# Patient Record
Sex: Male | Born: 1970 | Race: White | Hispanic: No | Marital: Married | Smoking: Never smoker
Health system: Southern US, Community
[De-identification: ages and names within clinical notes are randomized; demographics above are authoritative.]

## PROBLEM LIST (undated history)

## (undated) DIAGNOSIS — E119 Type 2 diabetes mellitus without complications: Secondary | ICD-10-CM

## (undated) DIAGNOSIS — K219 Gastro-esophageal reflux disease without esophagitis: Secondary | ICD-10-CM

## (undated) DIAGNOSIS — J45909 Unspecified asthma, uncomplicated: Secondary | ICD-10-CM

## (undated) DIAGNOSIS — I10 Essential (primary) hypertension: Secondary | ICD-10-CM

## (undated) HISTORY — PX: SHOULDER ARTHROSCOPY: SHX128

## (undated) HISTORY — PX: OTHER SURGICAL HISTORY: SHX169

## (undated) HISTORY — PX: ABDOMINAL SURGERY: SHX537

## (undated) HISTORY — PX: KNEE ARTHROPLASTY: SHX992

## (undated) HISTORY — PX: WISDOM TOOTH EXTRACTION: SHX21

---

## 2005-04-02 ENCOUNTER — Ambulatory Visit: Payer: Self-pay | Admitting: Oncology

## 2005-04-20 ENCOUNTER — Ambulatory Visit (HOSPITAL_COMMUNITY): Admission: RE | Admit: 2005-04-20 | Discharge: 2005-04-20 | Payer: Self-pay | Admitting: Thoracic Surgery

## 2016-05-12 DIAGNOSIS — D862 Sarcoidosis of lung with sarcoidosis of lymph nodes: Secondary | ICD-10-CM | POA: Diagnosis not present

## 2016-05-12 DIAGNOSIS — J45909 Unspecified asthma, uncomplicated: Secondary | ICD-10-CM | POA: Diagnosis not present

## 2016-05-12 DIAGNOSIS — E669 Obesity, unspecified: Secondary | ICD-10-CM | POA: Diagnosis not present

## 2016-05-12 DIAGNOSIS — E119 Type 2 diabetes mellitus without complications: Secondary | ICD-10-CM | POA: Diagnosis not present

## 2016-11-26 DIAGNOSIS — E119 Type 2 diabetes mellitus without complications: Secondary | ICD-10-CM | POA: Diagnosis not present

## 2016-11-26 DIAGNOSIS — Z0000 Encounter for general adult medical examination without abnormal findings: Secondary | ICD-10-CM | POA: Diagnosis not present

## 2016-11-26 DIAGNOSIS — D862 Sarcoidosis of lung with sarcoidosis of lymph nodes: Secondary | ICD-10-CM | POA: Diagnosis not present

## 2016-11-26 DIAGNOSIS — E785 Hyperlipidemia, unspecified: Secondary | ICD-10-CM | POA: Diagnosis not present

## 2017-02-22 DIAGNOSIS — J209 Acute bronchitis, unspecified: Secondary | ICD-10-CM | POA: Diagnosis not present

## 2018-02-09 DIAGNOSIS — J069 Acute upper respiratory infection, unspecified: Secondary | ICD-10-CM | POA: Diagnosis not present

## 2018-03-02 DIAGNOSIS — Z6830 Body mass index (BMI) 30.0-30.9, adult: Secondary | ICD-10-CM | POA: Diagnosis not present

## 2018-03-02 DIAGNOSIS — J4520 Mild intermittent asthma, uncomplicated: Secondary | ICD-10-CM | POA: Diagnosis not present

## 2018-03-02 DIAGNOSIS — Z1389 Encounter for screening for other disorder: Secondary | ICD-10-CM | POA: Diagnosis not present

## 2018-03-02 DIAGNOSIS — Z0000 Encounter for general adult medical examination without abnormal findings: Secondary | ICD-10-CM | POA: Diagnosis not present

## 2018-03-02 DIAGNOSIS — E119 Type 2 diabetes mellitus without complications: Secondary | ICD-10-CM | POA: Diagnosis not present

## 2018-03-02 DIAGNOSIS — E669 Obesity, unspecified: Secondary | ICD-10-CM | POA: Diagnosis not present

## 2018-03-22 DIAGNOSIS — M9905 Segmental and somatic dysfunction of pelvic region: Secondary | ICD-10-CM | POA: Diagnosis not present

## 2018-03-22 DIAGNOSIS — M545 Low back pain: Secondary | ICD-10-CM | POA: Diagnosis not present

## 2018-03-22 DIAGNOSIS — M542 Cervicalgia: Secondary | ICD-10-CM | POA: Diagnosis not present

## 2018-03-22 DIAGNOSIS — M9903 Segmental and somatic dysfunction of lumbar region: Secondary | ICD-10-CM | POA: Diagnosis not present

## 2020-10-08 ENCOUNTER — Other Ambulatory Visit: Payer: Self-pay | Admitting: Orthopaedic Surgery

## 2020-10-08 DIAGNOSIS — M25562 Pain in left knee: Secondary | ICD-10-CM

## 2020-10-19 ENCOUNTER — Ambulatory Visit
Admission: RE | Admit: 2020-10-19 | Discharge: 2020-10-19 | Disposition: A | Discharge status: Home / Self Care | Payer: Commercial Managed Care - PPO | Source: Ambulatory Visit | Attending: Orthopaedic Surgery | Admitting: Orthopaedic Surgery

## 2020-10-19 DIAGNOSIS — M25562 Pain in left knee: Secondary | ICD-10-CM

## 2020-12-16 NOTE — H&P (Incomplete)
KNEE ARTHROPLASTY ADMISSION H&P  Patient ID: JIBRAN CROOKSHANKS MRN: [id number] DOB/AGE: [birth year] 48 y.o.  Chief Complaint: left knee pain.  Planned Procedure Date: 01/13/21 Medical Clearance by ***   Cardiac Clearance by *** Additional clearance by ***   HPI: Kylor Valverde Kopplin is a 50 y.o. 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 The patient has a history of pain and functional disability in the left knee due to arthritis and has failed non-surgical conservative treatments for greater than 12 weeks to include {nonsurgical conservative treatment (must select two):3049030}.  Onset of symptoms was {abrupt, gradual:20671}, starting {1->10 years:3049031} years ago with {stable, gradual worsening, rapidly worsening:3049032} course since that time. The patient noted {no past surgery, prior procedures:3049033} on the left knee.  Patient currently rates pain at {1-10:3049035} out of 10 with activity. Patient has {night pain, worsening of pain with activity and weight bearing:3049036}.  Patient has evidence of {Radiographic or MRI evidence of (must document one of the below):3046104} by imaging studies.  There is no active infection.  No past medical history on file. *** The histories are not reviewed yet. Please review them in the "History" navigator section and refresh this SmartLink. Not on File Prior to Admission medications   Not on File   Social History   Socioeconomic History   Marital status: Married    Spouse name: Not on file   Number of children: Not on file   Years of education: Not on file   Highest education level: Not on file  Occupational History   Not on file  Tobacco Use   Smoking status: Not on file   Smokeless tobacco: Not on file  Substance and Sexual Activity   Alcohol use: Not on file   Drug use: Not on file   Sexual activity: Not on file  Other Topics Concern   Not on file  Social History Narrative   Not on file   Social Determinants of Health    Financial Resource Strain: Not on file  Food Insecurity: Not on file  Transportation Needs: Not on file  Physical Activity: Not on file  Stress: Not on file  Social Connections: Not on file   No family history on file.  ROS: Currently denies lightheadedness, dizziness, Fever, chills, CP, SOB.***   No personal history of DVT, PE, MI, or CVA.*** No loose teeth or dentures*** All other systems have been reviewed and were otherwise currently negative with the exception of those mentioned in the HPI and as above.  Objective: Vitals: Ht: *** Wt: *** lbs Temp: *** BP: *** Pulse: *** O2 ***% on room air.   Physical Exam: General: Alert, NAD.  Antalgic Gait *** HEENT: EOMI, Good Neck Extension *** Pulm: No increased work of breathing.  Clear B/L A/P w/o crackle or wheeze. *** CV: RRR, No m/g/r appreciated *** GI: soft, NT, ND. BS x 4 quadrants Neuro: CN II-XII grossly intact without focal deficit.  Sensation intact distally Skin: No lesions in the area of chief complaint MSK/Surgical Site: *** knee w/o redness or effusion.  *** JLT. ROM ***.  5/5 strength in extension and flexion.  +EHL/FHL.  NVI.  Stable varus and valgus stress.    Imaging Review Plain radiographs demonstrate {mild/mod/severe:3049053} degenerative joint disease of the {left/right/bi:30031} knee.   The overall alignment is{Neutral/varus:3049054}. The bone quality appears to be {good/fair/poor/excellent:33178} for age and reported activity level.  Preoperative templating of the joint replacement has been completed, documented, and submitted to the Operating Room personnel  in order to optimize intra-operative equipment management.  Assessment: OA LEFT KNEE Active Problems:   * No active hospital problems. *   Plan: Plan for Procedure(s): TOTAL KNEE ARTHROPLASTY  The patient history, physical exam, clinical judgement of the provider and imaging are consistent with end stage degenerative joint disease and total  joint arthroplasty is deemed medically necessary. The treatment options including medical management, injection therapy, and arthroplasty were discussed at length. The risks and benefits of Procedure(s): TOTAL KNEE ARTHROPLASTY were presented and reviewed.  The risks of nonoperative treatment, versus surgical intervention including but not limited to continued pain, aseptic loosening, stiffness, dislocation/subluxation, infection, bleeding, nerve injury, blood clots, cardiopulmonary complications, morbidity, mortality, among others were discussed. The patient verbalizes understanding and wishes to proceed with the plan.  Patient is being admitted for inpatient treatment for surgery, pain control, PT, prophylactic antibiotics, VTE prophylaxis, progressive ambulation, ADL's and discharge planning.   Dental prophylaxis discussed and recommended for 2 years postoperatively.***  The patient does not*** meet the criteria for TXA which will be used perioperatively.   ASA 81 mg BID ***ASA 325 mg ***Xarelto  will be used postoperatively for DVT prophylaxis in addition to SCDs, and early ambulation. Plan for ***Tylenol, Celebrex, oxycodone for pain.   Zofran for nausea and vomiting. Pharmacy- *** The patient is planning to be discharged home with OPPT*** HHPT*** ([facility]***) and into the care of *** who can be reached at *** Follow up appt 02/03/21 at 4:30pm     [facility] Office [phone] 12/16/2020 7:52 PM

## 2020-12-30 NOTE — Patient Instructions (Addendum)
DUE TO COVID-19 ONLY ONE VISITOR IS ALLOWED TO COME WITH YOU AND STAY IN THE WAITING ROOM ONLY DURING PRE OP AND PROCEDURE.   **NO VISITORS ARE ALLOWED IN THE SHORT STAY AREA OR RECOVERY ROOM!!**       Your procedure is scheduled on: 01/13/21   Report to [facility] Main Entrance    Report to admitting at 7:00 AM   Call this number if you have problems the morning of surgery [phone]   Do not eat food :After Midnight.   May have liquids until 6:40 AM day of surgery  CLEAR LIQUID DIET  Foods Allowed                                                                     Foods Excluded  Water, Black Coffee and tea (no milk or creamer)           liquids that you cannot  Plain Jell-O in any flavor  (No red)                                    see through such as: Fruit ices (not with fruit pulp)                                            milk, soups, orange juice              Iced Popsicles (No red)                                                All solid food                                   Apple juices Sports drinks like Gatorade (No red) Lightly seasoned clear broth or consume(fat free) Sugar   The day of surgery:  Drink ONE (1) Pre-Surgery G2 by 6:40 am the morning of surgery. Drink in one sitting. Do not sip.  This drink was given to you during your hospital  pre-op appointment visit. Nothing else to drink after completing the  Pre-Surgery G2.          If you have questions, please contact your surgeon's office.     Oral Hygiene is also important to reduce your risk of infection.                                    Remember - BRUSH YOUR TEETH THE MORNING OF SURGERY WITH YOUR REGULAR TOOTHPASTE   Do NOT use tobacco after Midnight   Take these medicines the morning of surgery with A SIP OF WATER: Inhaler, Omeprazole, Pravastatin  How to Manage Your Diabetes Before and After Surgery  Why is it important to control my blood sugar before and after  surgery? Improving blood sugar levels before and after surgery helps healing and can limit problems. A way of improving blood sugar control is eating a healthy diet by:  Eating less sugar and carbohydrates  Increasing activity/exercise  Talking with your doctor about reaching your blood sugar goals High blood sugars (greater than 180 mg/dL) can raise your risk of infections and slow your recovery, so you will need to focus on controlling your diabetes during the weeks before surgery. Make sure that the doctor who takes care of your diabetes knows about your planned surgery including the date and location.  How do I manage my blood sugar before surgery? Check your blood sugar at least 4 times a day, starting 2 days before surgery, to make sure that the level is not too high or low. Check your blood sugar the morning of your surgery when you wake up and every 2 hours until you get to the Short Stay unit. If your blood sugar is less than 70 mg/dL, you will need to treat for low blood sugar: Do not take insulin. Treat a low blood sugar (less than 70 mg/dL) with  cup of clear juice (cranberry or apple), 4 glucose tablets, OR glucose gel. Recheck blood sugar in 15 minutes after treatment (to make sure it is greater than 70 mg/dL). If your blood sugar is not greater than 70 mg/dL on recheck, call [phone] for further instructions. Report your blood sugar to the short stay nurse when you get to Short Stay.  If you are admitted to the hospital after surgery: Your blood sugar will be checked by the staff and you will probably be given insulin after surgery (instead of oral diabetes medicines) to make sure you have good blood sugar levels. The goal for blood sugar control after surgery is 80-180 mg/dL.                              You may not have any metal on your body including jewelry, and body piercing             Do not wear lotions, powders, cologne, or deodorant              Men may shave  face and neck.   Do not bring valuables to the hospital. [facility] IS NOT             RESPONSIBLE   FOR VALUABLES.    Patients discharged on the day of surgery will not be allowed to drive home.   Special Instructions: Bring a copy of your healthcare power of attorney and living will documents         the day of surgery if you haven't scanned them before.              Please read over the following fact sheets you were given: IF YOU HAVE QUESTIONS ABOUT YOUR PRE-OP INSTRUCTIONS PLEASE CALL [phone]- [facility] - Preparing for Surgery Before surgery, you can play an important role.  Because skin is not sterile, your skin needs to be as free of germs as possible.  You can reduce the number of germs on your skin by washing with CHG (chlorahexidine gluconate) soap before surgery.  CHG is an antiseptic cleaner which kills germs and bonds with the skin to continue killing germs even after washing. Please DO NOT use if you have an allergy to CHG or antibacterial soaps.  If your skin becomes reddened/irritated stop using the CHG and inform your nurse when you arrive at Short Stay. Do not shave (including legs and underarms) for at least 48 hours prior to the first CHG shower.  You may shave your face/neck.  Please follow these instructions carefully:  1.  Shower with CHG Soap the night before surgery and the  morning of surgery.  2.  If you choose to wash your hair, wash your hair first as usual with your normal  shampoo.  3.  After you shampoo, rinse your hair and body thoroughly to remove the shampoo.                             4.  Use CHG as you would any other liquid soap.  You can apply chg directly to the skin and wash.  Gently with a scrungie or clean washcloth.  5.  Apply the CHG Soap to your body ONLY FROM THE NECK DOWN.   Do   not use on face/ open                           Wound or open sores. Avoid contact with eyes, ears mouth and   genitals (private parts).                        Wash face,  Genitals (private parts) with your normal soap.             6.  Wash thoroughly, paying special attention to the area where your    surgery  will be performed.  7.  Thoroughly rinse your body with warm water from the neck down.  8.  DO NOT shower/wash with your normal soap after using and rinsing off the CHG Soap.                9.  Pat yourself dry with a clean towel.            10.  Wear clean pajamas.            11.  Place clean sheets on your bed the night of your first shower and do not  sleep with pets. Day of Surgery : Do not apply any lotions/deodorants the morning of surgery.  Please wear clean clothes to the hospital/surgery center.  FAILURE TO FOLLOW THESE INSTRUCTIONS MAY RESULT IN THE CANCELLATION OF YOUR SURGERY  PATIENT SIGNATURE_________________________________  NURSE SIGNATURE__________________________________  ________________________________________________________________________   Rogelia Mire  An incentive spirometer is a tool that can help keep your lungs clear and active. This tool measures how well you are filling your lungs with each breath. Taking long deep breaths may help reverse or decrease the chance of developing breathing (pulmonary) problems (especially infection) following: A long period of time when you are unable to move or be active. BEFORE THE PROCEDURE  If the spirometer includes an indicator to show your best effort, your nurse or respiratory therapist will set it to a desired goal. If possible, sit up straight or lean slightly forward. Try not to slouch. Hold the incentive spirometer in an upright position. INSTRUCTIONS FOR USE  Sit on the edge of your bed if possible, or sit up as far as you can in bed or on a chair. Hold the incentive spirometer in an upright position. Breathe out normally. Place the mouthpiece in your mouth and  seal your lips tightly around it. Breathe in slowly and as deeply as possible, raising  the piston or the ball toward the top of the column. Hold your breath for 3-5 seconds or for as long as possible. Allow the piston or ball to fall to the bottom of the column. Remove the mouthpiece from your mouth and breathe out normally. Rest for a few seconds and repeat Steps 1 through 7 at least 10 times every 1-2 hours when you are awake. Take your time and take a few normal breaths between deep breaths. The spirometer may include an indicator to show your best effort. Use the indicator as a goal to work toward during each repetition. After each set of 10 deep breaths, practice coughing to be sure your lungs are clear. If you have an incision (the cut made at the time of surgery), support your incision when coughing by placing a pillow or rolled up towels firmly against it. Once you are able to get out of bed, walk around indoors and cough well. You may stop using the incentive spirometer when instructed by your caregiver.  RISKS AND COMPLICATIONS Take your time so you do not get dizzy or light-headed. If you are in pain, you may need to take or ask for pain medication before doing incentive spirometry. It is harder to take a deep breath if you are having pain. AFTER USE Rest and breathe slowly and easily. It can be helpful to keep track of a log of your progress. Your caregiver can provide you with a simple table to help with this. If you are using the spirometer at home, follow these instructions: SEEK MEDICAL CARE IF:  You are having difficultly using the spirometer. You have trouble using the spirometer as often as instructed. Your pain medication is not giving enough relief while using the spirometer. You develop fever of 100.5 F (38.1 C) or higher. SEEK IMMEDIATE MEDICAL CARE IF:  You cough up bloody sputum that had not been present before. You develop fever of 102 F (38.9 C) or greater. You develop worsening pain at or near the incision site. MAKE SURE YOU:  Understand these  instructions. Will watch your condition. Will get help right away if you are not doing well or get worse. Document Released: 05/25/2006 Document Revised: 04/06/2011 Document Reviewed: 07/26/2006 [facility] Patient Information 2014 [place], [place].   ________________________________________________________________________

## 2020-12-30 NOTE — Progress Notes (Addendum)
COVID swab appointment:n/a  COVID Vaccine Completed: no Date COVID Vaccine completed: Has received booster: COVID vaccine manufacturer: Pfizer    [employer]   Date of COVID positive in last 90 days: no  PCP - Adair Laundry, NP Cardiologist - n/a  Clearance by Adair Laundry 10/28/20 on chart  Chest x-ray - n/a EKG - 12/31/20 Epic/chart Stress Test - years ago per pt ECHO - n/a Cardiac Cath - n/a Pacemaker/ICD device last checked: n/a Spinal Cord Stimulator: n/a  Sleep Study - n/a CPAP -   Fasting Blood Sugar - 95-115 Checks Blood Sugar _1_ times a day  Blood Thinner Instructions: Aspirin Instructions: ASA 81, 7 days Last Dose:  Activity level: Can go up a flight of stairs and perform activities of daily living without stopping and without symptoms of chest pain or shortness of breath.    Anesthesia review: HTN, DM, asthma  Patient denies shortness of breath, fever, cough and chest pain at PAT appointment   Patient verbalized understanding of instructions that were given to them at the PAT appointment. Patient was also instructed that they will need to review over the PAT instructions again at home before surgery.

## 2020-12-31 ENCOUNTER — Encounter (HOSPITAL_COMMUNITY)
Admission: RE | Admit: 2020-12-31 | Discharge: 2020-12-31 | Disposition: A | Discharge status: Home / Self Care | Payer: Commercial Managed Care - PPO | Source: Ambulatory Visit | Attending: Orthopedic Surgery | Admitting: Orthopedic Surgery

## 2020-12-31 ENCOUNTER — Encounter (HOSPITAL_COMMUNITY): Payer: Self-pay

## 2020-12-31 ENCOUNTER — Ambulatory Visit: Payer: Self-pay | Admitting: Physician Assistant

## 2020-12-31 VITALS — BP 148/93 | HR 85 | Temp 98.4°F | Resp 12 | Ht 72.0 in | Wt 206.6 lb

## 2020-12-31 DIAGNOSIS — E109 Type 1 diabetes mellitus without complications: Secondary | ICD-10-CM | POA: Insufficient documentation

## 2020-12-31 DIAGNOSIS — Z794 Long term (current) use of insulin: Secondary | ICD-10-CM | POA: Insufficient documentation

## 2020-12-31 DIAGNOSIS — Z01818 Encounter for other preprocedural examination: Secondary | ICD-10-CM | POA: Diagnosis not present

## 2020-12-31 DIAGNOSIS — I2510 Atherosclerotic heart disease of native coronary artery without angina pectoris: Secondary | ICD-10-CM | POA: Insufficient documentation

## 2020-12-31 HISTORY — DX: Type 2 diabetes mellitus without complications: E11.9

## 2020-12-31 HISTORY — DX: Gastro-esophageal reflux disease without esophagitis: K21.9

## 2020-12-31 HISTORY — DX: Essential (primary) hypertension: I10

## 2020-12-31 HISTORY — DX: Unspecified asthma, uncomplicated: J45.909

## 2020-12-31 LAB — BASIC METABOLIC PANEL
Anion gap: 12 (ref 5–15)
BUN: 8 mg/dL (ref 6–20)
CO2: 25 mmol/L (ref 22–32)
Calcium: 9.5 mg/dL (ref 8.9–10.3)
Chloride: 103 mmol/L (ref 98–111)
Creatinine, Ser: 0.65 mg/dL (ref 0.61–1.24)
GFR, Estimated: >60 mL/min (ref >60)
Glucose, Bld: 137 mg/dL — ABNORMAL HIGH (ref 70–99)
Potassium: 3.4 mmol/L — ABNORMAL LOW (ref 3.5–5.1)
Sodium: 140 mmol/L (ref 135–145)

## 2020-12-31 LAB — CBC
HCT: 43.8 % (ref 39.0–52.0)
Hemoglobin: 15.3 g/dL (ref 13.0–17.0)
MCH: 30.2 pg (ref 26.0–34.0)
MCHC: 34.9 g/dL (ref 30.0–36.0)
MCV: 86.4 fL (ref 80.0–100.0)
Platelets: 197 10*3/uL (ref 150–400)
RBC: 5.07 MIL/uL (ref 4.22–5.81)
RDW: 11.8 % (ref 11.5–15.5)
WBC: 7.8 10*3/uL (ref 4.0–10.5)
nRBC: 0 % (ref 0.0–0.2)

## 2020-12-31 LAB — HEMOGLOBIN A1C
Hgb A1c MFr Bld: 6.9 % — ABNORMAL HIGH (ref 4.8–5.6)
Mean Plasma Glucose: 151.33 mg/dL

## 2020-12-31 LAB — SURGICAL PCR SCREEN
MRSA, PCR: NEGATIVE
Staphylococcus aureus: NEGATIVE

## 2020-12-31 LAB — GLUCOSE, CAPILLARY: Glucose-Capillary: 176 mg/dL — ABNORMAL HIGH (ref 70–99)

## 2021-01-06 ENCOUNTER — Ambulatory Visit: Payer: Self-pay | Admitting: Physician Assistant
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
## 2021-01-12 NOTE — Anesthesia Preprocedure Evaluation (Addendum)
Anesthesia Evaluation  Patient identified by MRN, date of birth, ID band Patient awake    Reviewed: Allergy & Precautions, NPO status , Patient's Chart, lab work & pertinent test results  Airway Mallampati: II  TM Distance: >3 FB Neck ROM: Full    Dental no notable dental hx. (+) Teeth Intact, Dental Advisory Given   Pulmonary    Pulmonary exam normal breath sounds clear to auscultation       Cardiovascular hypertension, Pt. on medications Normal cardiovascular exam Rhythm:Regular Rate:Normal     Neuro/Psych negative neurological ROS  negative psych ROS   GI/Hepatic Neg liver ROS, GERD  Controlled and Medicated,  Endo/Other  diabetes  Renal/GU Lab Results      Component                Value               Date                      CREATININE               0.65                12/31/2020                  K                        3.4 (L)             12/31/2020                        Musculoskeletal   Abdominal   Peds  Hematology Lab Results      Component                Value               Date                      WBC                      7.8                 12/31/2020                HGB                      15.3                12/31/2020                HCT                      43.8                12/31/2020                MCV                      86.4                12/31/2020                PLT                      197  12/31/2020              Anesthesia Other Findings All: PCN Acetaminophen  Reproductive/Obstetrics                            Anesthesia Physical Anesthesia Plan  ASA: 2  Anesthesia Plan: Spinal and Regional   Post-op Pain Management: Regional block, Minimal or no pain anticipated and Dilaudid IV   Induction:   PONV Risk Score and Plan: 2 and Midazolam, Ondansetron and Treatment may vary due to age or medical condition  Airway Management Planned:  Natural Airway and Nasal Cannula  Additional Equipment: None  Intra-op Plan:   Post-operative Plan:   Informed Consent: I have reviewed the patients History and Physical, chart, labs and discussed the procedure including the risks, benefits and alternatives for the proposed anesthesia with the patient or authorized representative who has indicated his/her understanding and acceptance.     Dental advisory given  Plan Discussed with: CRNA  Anesthesia Plan Comments: (Sp w L Adductor Canal)       Anesthesia Quick Evaluation

## 2021-01-13 ENCOUNTER — Ambulatory Visit (HOSPITAL_COMMUNITY): Payer: Commercial Managed Care - PPO | Admitting: Physician Assistant

## 2021-01-13 ENCOUNTER — Ambulatory Visit (HOSPITAL_COMMUNITY): Payer: Commercial Managed Care - PPO | Admitting: Anesthesiology

## 2021-01-13 ENCOUNTER — Ambulatory Visit (HOSPITAL_COMMUNITY): Payer: Commercial Managed Care - PPO

## 2021-01-13 ENCOUNTER — Ambulatory Visit (HOSPITAL_COMMUNITY)
Admission: RE | Admit: 2021-01-13 | Discharge: 2021-01-13 | Disposition: A | Discharge status: Home / Self Care | Payer: Commercial Managed Care - PPO | Source: Ambulatory Visit | Attending: Orthopedic Surgery | Admitting: Orthopedic Surgery

## 2021-01-13 ENCOUNTER — Encounter (HOSPITAL_COMMUNITY): Payer: Self-pay | Admitting: Orthopedic Surgery

## 2021-01-13 ENCOUNTER — Encounter (HOSPITAL_COMMUNITY)
Admission: RE | Disposition: A | Discharge status: Home / Self Care | Payer: Self-pay | Source: Ambulatory Visit | Attending: Orthopedic Surgery

## 2021-01-13 DIAGNOSIS — R2689 Other abnormalities of gait and mobility: Secondary | ICD-10-CM | POA: Diagnosis not present

## 2021-01-13 DIAGNOSIS — W3301XS Accidental discharge of shotgun, sequela: Secondary | ICD-10-CM | POA: Diagnosis not present

## 2021-01-13 DIAGNOSIS — M1732 Unilateral post-traumatic osteoarthritis, left knee: Secondary | ICD-10-CM | POA: Diagnosis not present

## 2021-01-13 DIAGNOSIS — Z419 Encounter for procedure for purposes other than remedying health state, unspecified: Secondary | ICD-10-CM

## 2021-01-13 DIAGNOSIS — Z9889 Other specified postprocedural states: Secondary | ICD-10-CM

## 2021-01-13 DIAGNOSIS — M6281 Muscle weakness (generalized): Secondary | ICD-10-CM | POA: Insufficient documentation

## 2021-01-13 HISTORY — PX: TOTAL KNEE ARTHROPLASTY: SHX125

## 2021-01-13 LAB — GLUCOSE, CAPILLARY
Glucose-Capillary: 95 mg/dL (ref 70–99)
Glucose-Capillary: 155 mg/dL — ABNORMAL HIGH (ref 70–99)

## 2021-01-13 SURGERY — ARTHROPLASTY, KNEE, TOTAL
Anesthesia: Regional | Site: Knee | Laterality: Left

## 2021-01-13 MED ORDER — HYDROMORPHONE HCL 1 MG/ML IJ SOLN
INTRAMUSCULAR | Status: AC
  Administered 2021-01-13: 16:00:00 0.5 mg via INTRAVENOUS
  Filled 2021-01-13: qty 1

## 2021-01-13 MED ORDER — LACTATED RINGERS IV BOLUS
250.0000 mL | Freq: Once | INTRAVENOUS | Status: AC
  Administered 2021-01-13: 15:00:00 250 mL via INTRAVENOUS

## 2021-01-13 MED ORDER — PHENYLEPHRINE HCL (PRESSORS) 10 MG/ML IV SOLN
INTRAVENOUS | Status: AC
  Filled 2021-01-13: qty 1

## 2021-01-13 MED ORDER — ACETAMINOPHEN 500 MG PO TABS: 1000.0000 mg | ORAL_TABLET | Freq: Once | ORAL | Status: DC

## 2021-01-13 MED ORDER — OXYCODONE HCL 5 MG PO TABS
5.0000 mg | ORAL_TABLET | Freq: Once | ORAL | Status: AC | PRN
  Administered 2021-01-13: 15:00:00 5 mg via ORAL

## 2021-01-13 MED ORDER — BUPIVACAINE LIPOSOME 1.3 % IJ SUSP: 20.0000 mL | Freq: Once | INTRAMUSCULAR | Status: DC

## 2021-01-13 MED ORDER — BUPIVACAINE LIPOSOME 1.3 % IJ SUSP
INTRAMUSCULAR | Status: AC
  Filled 2021-01-13: qty 20

## 2021-01-13 MED ORDER — OXYCODONE HCL 5 MG/5ML PO SOLN: 5.0000 mg | Freq: Once | ORAL | Status: AC | PRN

## 2021-01-13 MED ORDER — ASPIRIN EC 81 MG PO TBEC: 81.0000 mg | DELAYED_RELEASE_TABLET | Freq: Two times a day (BID) | ORAL | 0 refills | Status: AC

## 2021-01-13 MED ORDER — WATER FOR IRRIGATION, STERILE IR SOLN
Status: DC | PRN
  Administered 2021-01-13: 2000 mL

## 2021-01-13 MED ORDER — LACTATED RINGERS IV BOLUS: 250.0000 mL | Freq: Once | INTRAVENOUS | Status: DC

## 2021-01-13 MED ORDER — KETOROLAC TROMETHAMINE 15 MG/ML IJ SOLN: 15.0000 mg | Freq: Four times a day (QID) | INTRAMUSCULAR | Status: DC

## 2021-01-13 MED ORDER — HYDROMORPHONE HCL 1 MG/ML IJ SOLN: 0.5000 mg | INTRAMUSCULAR | Status: DC | PRN

## 2021-01-13 MED ORDER — DEXAMETHASONE SODIUM PHOSPHATE 10 MG/ML IJ SOLN
INTRAMUSCULAR | Status: AC
  Filled 2021-01-13: qty 1

## 2021-01-13 MED ORDER — BUPIVACAINE LIPOSOME 1.3 % IJ SUSP
INTRAMUSCULAR | Status: DC | PRN
  Administered 2021-01-13: 20 mL

## 2021-01-13 MED ORDER — ROPIVACAINE HCL 5 MG/ML IJ SOLN
INTRAMUSCULAR | Status: DC | PRN
  Administered 2021-01-13: 50 mg via PERINEURAL

## 2021-01-13 MED ORDER — METHOCARBAMOL 500 MG PO TABS: 500.0000 mg | ORAL_TABLET | Freq: Three times a day (TID) | ORAL | 0 refills | Status: AC | PRN

## 2021-01-13 MED ORDER — CEFADROXIL 500 MG PO CAPS: 500.0000 mg | ORAL_CAPSULE | Freq: Two times a day (BID) | ORAL | 0 refills | Status: AC

## 2021-01-13 MED ORDER — ONDANSETRON HCL 4 MG/2ML IJ SOLN
INTRAMUSCULAR | Status: AC
  Filled 2021-01-13: qty 2

## 2021-01-13 MED ORDER — POVIDONE-IODINE 10 % EX SWAB
2.0000 "application " | Freq: Once | CUTANEOUS | Status: AC
  Administered 2021-01-13: 2 via TOPICAL

## 2021-01-13 MED ORDER — METHOCARBAMOL 500 MG IVPB - SIMPLE MED
INTRAVENOUS | Status: AC
  Filled 2021-01-13: qty 50

## 2021-01-13 MED ORDER — MIDAZOLAM HCL 2 MG/2ML IJ SOLN
INTRAMUSCULAR | Status: AC
  Filled 2021-01-13: qty 2

## 2021-01-13 MED ORDER — ACETAMINOPHEN 10 MG/ML IV SOLN: 1000.0000 mg | Freq: Once | INTRAVENOUS | Status: DC | PRN

## 2021-01-13 MED ORDER — ONDANSETRON HCL 4 MG PO TABS: 4.0000 mg | ORAL_TABLET | Freq: Three times a day (TID) | ORAL | 0 refills | Status: AC | PRN

## 2021-01-13 MED ORDER — 0.9 % SODIUM CHLORIDE (POUR BTL) OPTIME
TOPICAL | Status: DC | PRN
  Administered 2021-01-13 (×2): 500 mL

## 2021-01-13 MED ORDER — METOCLOPRAMIDE HCL 5 MG/ML IJ SOLN: 5.0000 mg | Freq: Three times a day (TID) | INTRAMUSCULAR | Status: DC | PRN

## 2021-01-13 MED ORDER — PROPOFOL 10 MG/ML IV BOLUS
INTRAVENOUS | Status: DC | PRN
  Administered 2021-01-13: 20 mg via INTRAVENOUS
  Administered 2021-01-13: 40 mg via INTRAVENOUS

## 2021-01-13 MED ORDER — OXYCODONE HCL 5 MG PO TABS: 5.0000 mg | ORAL_TABLET | ORAL | 0 refills | Status: AC | PRN

## 2021-01-13 MED ORDER — LACTATED RINGERS IV BOLUS
500.0000 mL | Freq: Once | INTRAVENOUS | Status: AC
  Administered 2021-01-13: 14:00:00 500 mL via INTRAVENOUS

## 2021-01-13 MED ORDER — AMISULPRIDE (ANTIEMETIC) 5 MG/2ML IV SOLN: 10.0000 mg | Freq: Once | INTRAVENOUS | Status: DC | PRN

## 2021-01-13 MED ORDER — FENTANYL CITRATE PF 50 MCG/ML IJ SOSY
50.0000 ug | PREFILLED_SYRINGE | INTRAMUSCULAR | Status: DC
  Administered 2021-01-13: 09:00:00 100 ug via INTRAVENOUS
  Filled 2021-01-13: qty 2

## 2021-01-13 MED ORDER — FENTANYL CITRATE (PF) 100 MCG/2ML IJ SOLN
INTRAMUSCULAR | Status: AC
  Filled 2021-01-13: qty 2

## 2021-01-13 MED ORDER — METHOCARBAMOL 500 MG IVPB - SIMPLE MED
500.0000 mg | Freq: Four times a day (QID) | INTRAVENOUS | Status: DC | PRN
  Administered 2021-01-13: 15:00:00 500 mg via INTRAVENOUS

## 2021-01-13 MED ORDER — METHOCARBAMOL 500 MG PO TABS: 500.0000 mg | ORAL_TABLET | Freq: Four times a day (QID) | ORAL | Status: DC | PRN

## 2021-01-13 MED ORDER — OXYCODONE HCL 5 MG PO TABS: 10.0000 mg | ORAL_TABLET | ORAL | Status: DC | PRN

## 2021-01-13 MED ORDER — ONDANSETRON HCL 4 MG/2ML IJ SOLN
INTRAMUSCULAR | Status: DC | PRN
  Administered 2021-01-13: 4 mg via INTRAVENOUS

## 2021-01-13 MED ORDER — CELECOXIB 200 MG PO CAPS
400.0000 mg | ORAL_CAPSULE | Freq: Once | ORAL | Status: AC
  Administered 2021-01-13: 08:00:00 400 mg via ORAL
  Filled 2021-01-13: qty 2

## 2021-01-13 MED ORDER — DEXAMETHASONE SODIUM PHOSPHATE 10 MG/ML IJ SOLN
8.0000 mg | Freq: Once | INTRAMUSCULAR | Status: AC
  Administered 2021-01-13: 11:00:00 8 mg via INTRAVENOUS

## 2021-01-13 MED ORDER — HYDROMORPHONE HCL 1 MG/ML IJ SOLN
0.2500 mg | INTRAMUSCULAR | Status: DC | PRN
  Administered 2021-01-13 (×2): 0.5 mg via INTRAVENOUS

## 2021-01-13 MED ORDER — CLONIDINE HCL (ANALGESIA) 100 MCG/ML EP SOLN
EPIDURAL | Status: DC | PRN
  Administered 2021-01-13: 100 ug

## 2021-01-13 MED ORDER — SODIUM CHLORIDE (PF) 0.9 % IJ SOLN
INTRAMUSCULAR | Status: AC
  Filled 2021-01-13: qty 10

## 2021-01-13 MED ORDER — CHLORHEXIDINE GLUCONATE 0.12 % MT SOLN
15.0000 mL | Freq: Once | OROMUCOSAL | Status: AC
  Administered 2021-01-13: 08:00:00 15 mL via OROMUCOSAL

## 2021-01-13 MED ORDER — LACTATED RINGERS IV SOLN: INTRAVENOUS | Status: DC

## 2021-01-13 MED ORDER — CEFAZOLIN SODIUM-DEXTROSE 2-4 GM/100ML-% IV SOLN: 2.0000 g | Freq: Four times a day (QID) | INTRAVENOUS | Status: DC

## 2021-01-13 MED ORDER — FENTANYL CITRATE (PF) 100 MCG/2ML IJ SOLN
INTRAMUSCULAR | Status: DC | PRN
  Administered 2021-01-13 (×2): 50 ug via INTRAVENOUS

## 2021-01-13 MED ORDER — CEFAZOLIN SODIUM-DEXTROSE 2-4 GM/100ML-% IV SOLN
2.0000 g | INTRAVENOUS | Status: AC
  Administered 2021-01-13: 11:00:00 2 g via INTRAVENOUS
  Filled 2021-01-13: qty 100

## 2021-01-13 MED ORDER — SODIUM CHLORIDE (PF) 0.9 % IJ SOLN
INTRAMUSCULAR | Status: DC | PRN
  Administered 2021-01-13: 60 mL

## 2021-01-13 MED ORDER — ORAL CARE MOUTH RINSE: 15.0000 mL | Freq: Once | OROMUCOSAL | Status: AC

## 2021-01-13 MED ORDER — METOCLOPRAMIDE HCL 5 MG PO TABS: 5.0000 mg | ORAL_TABLET | Freq: Three times a day (TID) | ORAL | Status: DC | PRN

## 2021-01-13 MED ORDER — POVIDONE-IODINE 10 % EX SWAB: 2.0000 "application " | Freq: Once | CUTANEOUS | Status: DC

## 2021-01-13 MED ORDER — PROPOFOL 1000 MG/100ML IV EMUL
INTRAVENOUS | Status: AC
  Filled 2021-01-13: qty 200

## 2021-01-13 MED ORDER — ONDANSETRON HCL 4 MG/2ML IJ SOLN: 4.0000 mg | Freq: Four times a day (QID) | INTRAMUSCULAR | Status: DC | PRN

## 2021-01-13 MED ORDER — HYDROMORPHONE HCL 1 MG/ML IJ SOLN
INTRAMUSCULAR | Status: AC
  Administered 2021-01-13: 15:00:00 0.5 mg via INTRAVENOUS
  Filled 2021-01-13: qty 1

## 2021-01-13 MED ORDER — OXYCODONE HCL 5 MG PO TABS
ORAL_TABLET | ORAL | Status: AC
  Filled 2021-01-13: qty 1

## 2021-01-13 MED ORDER — BUPIVACAINE IN DEXTROSE 0.75-8.25 % IT SOLN
INTRATHECAL | Status: DC | PRN
  Administered 2021-01-13: 2 mL via INTRATHECAL

## 2021-01-13 MED ORDER — TRANEXAMIC ACID-NACL 1000-0.7 MG/100ML-% IV SOLN
1000.0000 mg | INTRAVENOUS | Status: AC
  Administered 2021-01-13: 11:00:00 1000 mg via INTRAVENOUS
  Filled 2021-01-13: qty 100

## 2021-01-13 MED ORDER — PROPOFOL 500 MG/50ML IV EMUL
INTRAVENOUS | Status: DC | PRN
  Administered 2021-01-13: 100 ug/kg/min via INTRAVENOUS

## 2021-01-13 MED ORDER — ONDANSETRON HCL 4 MG/2ML IJ SOLN: 4.0000 mg | Freq: Once | INTRAMUSCULAR | Status: DC | PRN

## 2021-01-13 MED ORDER — SODIUM CHLORIDE 0.9 % IR SOLN
Status: DC | PRN
  Administered 2021-01-13: 3000 mL

## 2021-01-13 MED ORDER — ONDANSETRON HCL 4 MG PO TABS: 4.0000 mg | ORAL_TABLET | Freq: Four times a day (QID) | ORAL | Status: DC | PRN

## 2021-01-13 MED ORDER — MIDAZOLAM HCL 2 MG/2ML IJ SOLN
1.0000 mg | INTRAMUSCULAR | Status: DC
  Administered 2021-01-13: 09:00:00 2 mg via INTRAVENOUS
  Filled 2021-01-13: qty 2

## 2021-01-13 MED ORDER — OXYCODONE HCL 5 MG PO TABS: 5.0000 mg | ORAL_TABLET | ORAL | Status: DC | PRN

## 2021-01-13 SURGICAL SUPPLY — 69 items
BAG COUNTER SPONGE SURGICOUNT (BAG) ×1 IMPLANT
BAG DECANTER FOR FLEXI CONT (MISCELLANEOUS) ×2 IMPLANT
BAG SURGICOUNT SPONGE COUNTING (BAG) ×1
BLADE HEX COATED 2.75 (ELECTRODE) ×3 IMPLANT
BLADE SAG 18X100X1.27 (BLADE) ×3 IMPLANT
BLADE SAW SAG 35X64 .89 (BLADE) ×3 IMPLANT
BNDG COHESIVE 4X5 TAN ST LF (GAUZE/BANDAGES/DRESSINGS) ×2 IMPLANT
BNDG ELASTIC 6X10 VLCR STRL LF (GAUZE/BANDAGES/DRESSINGS) ×3 IMPLANT
BOWL SMART MIX CTS (DISPOSABLE) ×2 IMPLANT
CEMENT BONE REFOBACIN R1X40 US (Cement) ×4 IMPLANT
CHLORAPREP W/TINT 26 (MISCELLANEOUS) ×6 IMPLANT
CNTNR URN SCR LID CUP LEK RST (MISCELLANEOUS) IMPLANT
COMP FEM CEMT PERSONA STD SZ7 (Knees) ×3 IMPLANT
COMPONENT FEM CMT PRSN STD SZ7 (Knees) IMPLANT
CONT SPEC 4OZ STRL OR WHT (MISCELLANEOUS) ×3
COVER SURGICAL LIGHT HANDLE (MISCELLANEOUS) ×3 IMPLANT
CUFF TOURN SGL QUICK 34 (TOURNIQUET CUFF) ×3
CUFF TRNQT CYL 34X4.125X (TOURNIQUET CUFF) ×1 IMPLANT
DECANTER SPIKE VIAL GLASS SM (MISCELLANEOUS) ×1 IMPLANT
DERMABOND ADVANCED (GAUZE/BANDAGES/DRESSINGS) ×2
DERMABOND ADVANCED .7 DNX12 (GAUZE/BANDAGES/DRESSINGS) ×1 IMPLANT
DRAPE BILATERAL LIMB T (DRAPES) ×2 IMPLANT
DRAPE C-ARM 42X120 X-RAY (DRAPES) ×2 IMPLANT
DRAPE C-ARMOR (DRAPES) ×2 IMPLANT
DRAPE INCISE IOBAN 66X45 STRL (DRAPES) ×2 IMPLANT
DRAPE INCISE IOBAN 85X60 (DRAPES) ×3 IMPLANT
DRAPE SHEET LG 3/4 BI-LAMINATE (DRAPES) ×3 IMPLANT
DRAPE U-SHAPE 47X51 STRL (DRAPES) ×3 IMPLANT
DRESSING AQUACEL AG SP 3.5X10 (GAUZE/BANDAGES/DRESSINGS) ×1 IMPLANT
DRSG AQUACEL AG SP 3.5X10 (GAUZE/BANDAGES/DRESSINGS) ×3
GLOVE SRG 8 PF TXTR STRL LF DI (GLOVE) ×1 IMPLANT
GLOVE SURG ENC MOIS LTX SZ8 (GLOVE) ×6 IMPLANT
GLOVE SURG UNDER POLY LF SZ8 (GLOVE) ×3
GOWN STRL REUS W/TWL XL LVL3 (GOWN DISPOSABLE) ×3 IMPLANT
HANDPIECE INTERPULSE COAX TIP (DISPOSABLE) ×3
HEADED SCREW 33MM LENGTH (Screw) ×3 IMPLANT
HEADED SCREW 48MM LENGTH (Screw) ×8 IMPLANT
HOOD PEEL AWAY FLYTE STAYCOOL (MISCELLANEOUS) ×9 IMPLANT
INSERT TIB ARTISURF SZ6-7 R 11 (Joint) ×2 IMPLANT
IRRIGATION SURGIPHOR STRL (IV SOLUTION) ×3 IMPLANT
MANIFOLD NEPTUNE II (INSTRUMENTS) ×3 IMPLANT
MARKER SKIN DUAL TIP RULER LAB (MISCELLANEOUS) ×6 IMPLANT
NS IRRIG 1000ML POUR BTL (IV SOLUTION) ×3 IMPLANT
PACK TOTAL KNEE CUSTOM (KITS) ×3 IMPLANT
PROTECTOR NERVE ULNAR (MISCELLANEOUS) ×3 IMPLANT
SCREW HEADED 33MM KNEE (MISCELLANEOUS) ×4 IMPLANT
SCREW HEADED 48MM KNEE (MISCELLANEOUS) ×6 IMPLANT
SET HNDPC FAN SPRY TIP SCT (DISPOSABLE) ×1 IMPLANT
SPONGE T-LAP 18X18 ~~LOC~~+RFID (SPONGE) ×6 IMPLANT
STEM POLY PAT PLY 35M KNEE (Knees) ×2 IMPLANT
STEM TIB ST PERS 14+30 (Stem) ×2 IMPLANT
STEM TIBIA 5 DEG SZ F L KNEE (Knees) IMPLANT
SUT ETHIBOND NAB CT1 #1 30IN (SUTURE) ×4 IMPLANT
SUT MNCRL AB 3-0 PS2 18 (SUTURE) ×3 IMPLANT
SUT STRATAFIX 0 PDS 27 VIOLET (SUTURE) ×3
SUT STRATAFIX PDO 1 14 VIOLET (SUTURE) ×6
SUT STRATFX PDO 1 14 VIOLET (SUTURE) ×2
SUT VIC AB 0 CT1 36 (SUTURE) ×2 IMPLANT
SUT VIC AB 1 CT1 36 (SUTURE) ×2 IMPLANT
SUT VIC AB 2-0 CT2 27 (SUTURE) ×6 IMPLANT
SUTURE STRATFX 0 PDS 27 VIOLET (SUTURE) ×1 IMPLANT
SUTURE STRATFX PDO 1 14 VIOLET (SUTURE) ×1 IMPLANT
SYR 10ML LL (SYRINGE) ×2 IMPLANT
SYR 50ML LL SCALE MARK (SYRINGE) ×3 IMPLANT
TIBIA STEM 5 DEG SZ F L KNEE (Knees) ×3 IMPLANT
TRAY FOLEY MTR SLVR 14FR STAT (SET/KITS/TRAYS/PACK) IMPLANT
TRAY FOLEY MTR SLVR 16FR STAT (SET/KITS/TRAYS/PACK) ×2 IMPLANT
TUBE SUCTION HIGH CAP CLEAR NV (SUCTIONS) ×3 IMPLANT
WRAP KNEE MAXI GEL POST OP (GAUZE/BANDAGES/DRESSINGS) ×2 IMPLANT
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
## 2021-01-13 NOTE — Progress Notes (Signed)
AssistedDr. Houser with left, ultrasound guided, adductor canal block. Side rails up, monitors on throughout procedure. See vital signs in flow sheet. Tolerated Procedure well.  
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
## 2021-01-13 NOTE — Transfer of Care (Signed)
Immediate Anesthesia Transfer of Care Note  Patient: Ronnie Nguyen  Procedure(s) Performed: TOTAL KNEE ARTHROPLASTY (Left: Knee)  Patient Location: PACU  Anesthesia Type:Spinal and MAC combined with regional for post-op pain  Level of Consciousness: awake, alert , oriented and patient cooperative  Airway & Oxygen Therapy: Patient Spontanous Breathing  Post-op Assessment: Report given to RN and Post -op Vital signs reviewed and stable  Post vital signs: Reviewed and stable  Last Vitals:  Vitals Value Taken Time  BP 146/102 01/13/21 1400  Temp    Pulse 97 01/13/21 1402  Resp 20 01/13/21 1402  SpO2 97 % 01/13/21 1402  Vitals shown include unvalidated device data.  Last Pain:  Vitals:   01/13/21 1000  TempSrc:   PainSc: 0-No pain      Patients Stated Pain Goal: 5 (32/20/25 4270)  Complications: No notable events documented.

## 2021-01-13 NOTE — Discharge Instructions (Signed)
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
## 2021-01-13 NOTE — Anesthesia Postprocedure Evaluation (Signed)
Anesthesia Post Note  Patient: Ronnie Nguyen  Procedure(s) Performed: TOTAL KNEE ARTHROPLASTY (Left: Knee)     Patient location during evaluation: Nursing Unit Anesthesia Type: Regional and Spinal Level of consciousness: oriented and awake and alert Pain management: pain level controlled Vital Signs Assessment: post-procedure vital signs reviewed and stable Respiratory status: spontaneous breathing and respiratory function stable Cardiovascular status: blood pressure returned to baseline and stable Postop Assessment: no headache, no backache, no apparent nausea or vomiting and patient able to bend at knees Anesthetic complications: no   No notable events documented.  Last Vitals:  Vitals:   01/13/21 1415 01/13/21 1430  BP: 121/71 106/75  Pulse: 87 84  Resp: 14 15  Temp:    SpO2: 95% 95%    Last Pain:  Vitals:   01/13/21 1430  TempSrc:   PainSc: 0-No pain    LLE Motor Response: Purposeful movement (01/13/21 1430) LLE Sensation: Numbness (01/13/21 1430) RLE Motor Response: Purposeful movement (01/13/21 1430) RLE Sensation: Numbness (01/13/21 1430) L Sensory Level: L5-Outer lower leg, top of foot, great toe (01/13/21 1430) R Sensory Level: S1-Sole of foot, small toes (01/13/21 1430)  Trevor Iha

## 2021-01-13 NOTE — Interval H&P Note (Signed)
The patient has been re-examined, and the chart reviewed, and there have been no interval changes to the documented history and physical.    The risks benefits and alternatives were discussed with the patient including but not limited to the risks of nonoperative treatment, versus surgical intervention including infection, bleeding, nerve injury, the need for revision surgery, hardware prominence, hardware failure, the need for hardware removal, blood clots, cardiopulmonary complications, morbidity, mortality, among others, and they were willing to proceed.  Consent was signed by myself and the patient.  Left knee was marked.

## 2021-01-13 NOTE — Anesthesia Procedure Notes (Signed)
Anesthesia Regional Block: Adductor canal block   Pre-Anesthetic Checklist: , timeout performed,  Correct Patient, Correct Site, Correct Laterality,  Correct Procedure, Correct Position, site marked,  Risks and benefits discussed,  Surgical consent,  Pre-op evaluation,  At surgeon's request and post-op pain management  Laterality: Lower and Left  Prep: chloraprep       Needles:  Injection technique: Single-shot  Needle Type: Echogenic Needle     Needle Length: 9cm  Needle Gauge: 22     Additional Needles:   Procedures:,,,, ultrasound used (permanent image in chart),,    Narrative:  Start time: 01/13/2021 8:52 AM End time: 01/13/2021 9:00 AM Injection made incrementally with aspirations every 5 mL.  Performed by: Personally  Anesthesiologist: Trevor Iha, MD  Additional Notes: Block assessed prior to surgery. Pt tolerated procedure well.

## 2021-01-14 ENCOUNTER — Encounter (HOSPITAL_COMMUNITY): Payer: Self-pay | Admitting: Orthopedic Surgery

## 2021-01-18 LAB — AEROBIC/ANAEROBIC CULTURE W GRAM STAIN (SURGICAL/DEEP WOUND): Culture: NO GROWTH

## 2021-01-24 NOTE — H&P (Signed)
Addendum to H&P: Patient's preop exam that was unchanged on the morning before surgery: On examination he ambulates with an antalgic gait. He has a varus deformity to his knee. He has no pain over the greater trochanter on the left side and no pain with flexion or internal rotation. He has multiple well healed  incisions over the anterior aspect of the knee including a midline anterior incision and a transverse incision over the knee.  These all appear well healed without erythema. Minimal knee effusion. His range of motion is approximately 0-60 degrees. It is stable to varus and valgus stress. Negative anterior and posterior drawer. He has active, intact knees in extension. He  has 5/5 strength in the bilateral lower extremities.  Sensation intact distally below both knees. Feet are warm and well perfused.

## 2021-05-13 DIAGNOSIS — M79652 Pain in left thigh: Secondary | ICD-10-CM | POA: Diagnosis not present

## 2021-05-13 DIAGNOSIS — R2689 Other abnormalities of gait and mobility: Secondary | ICD-10-CM | POA: Diagnosis not present

## 2021-05-13 DIAGNOSIS — M25662 Stiffness of left knee, not elsewhere classified: Secondary | ICD-10-CM | POA: Diagnosis not present

## 2021-05-20 DIAGNOSIS — M79652 Pain in left thigh: Secondary | ICD-10-CM | POA: Diagnosis not present

## 2021-05-20 DIAGNOSIS — M25662 Stiffness of left knee, not elsewhere classified: Secondary | ICD-10-CM | POA: Diagnosis not present

## 2021-05-20 DIAGNOSIS — R2689 Other abnormalities of gait and mobility: Secondary | ICD-10-CM | POA: Diagnosis not present

## 2021-05-27 DIAGNOSIS — M25662 Stiffness of left knee, not elsewhere classified: Secondary | ICD-10-CM | POA: Diagnosis not present

## 2021-05-27 DIAGNOSIS — R2689 Other abnormalities of gait and mobility: Secondary | ICD-10-CM | POA: Diagnosis not present

## 2021-05-27 DIAGNOSIS — M79652 Pain in left thigh: Secondary | ICD-10-CM | POA: Diagnosis not present

## 2021-06-05 DIAGNOSIS — S76102D Unspecified injury of left quadriceps muscle, fascia and tendon, subsequent encounter: Secondary | ICD-10-CM | POA: Diagnosis not present

## 2021-06-10 DIAGNOSIS — M25622 Stiffness of left elbow, not elsewhere classified: Secondary | ICD-10-CM | POA: Diagnosis not present

## 2021-06-10 DIAGNOSIS — R2689 Other abnormalities of gait and mobility: Secondary | ICD-10-CM | POA: Diagnosis not present

## 2021-06-10 DIAGNOSIS — M79652 Pain in left thigh: Secondary | ICD-10-CM | POA: Diagnosis not present

## 2021-06-19 DIAGNOSIS — M79652 Pain in left thigh: Secondary | ICD-10-CM | POA: Diagnosis not present

## 2021-06-19 DIAGNOSIS — R2689 Other abnormalities of gait and mobility: Secondary | ICD-10-CM | POA: Diagnosis not present

## 2021-06-19 DIAGNOSIS — M25662 Stiffness of left knee, not elsewhere classified: Secondary | ICD-10-CM | POA: Diagnosis not present

## 2021-06-24 DIAGNOSIS — M79652 Pain in left thigh: Secondary | ICD-10-CM | POA: Diagnosis not present

## 2021-06-24 DIAGNOSIS — M25662 Stiffness of left knee, not elsewhere classified: Secondary | ICD-10-CM | POA: Diagnosis not present

## 2021-06-24 DIAGNOSIS — R2689 Other abnormalities of gait and mobility: Secondary | ICD-10-CM | POA: Diagnosis not present

## 2021-07-01 DIAGNOSIS — M79652 Pain in left thigh: Secondary | ICD-10-CM | POA: Diagnosis not present

## 2021-07-01 DIAGNOSIS — M25662 Stiffness of left knee, not elsewhere classified: Secondary | ICD-10-CM | POA: Diagnosis not present

## 2021-07-01 DIAGNOSIS — R2689 Other abnormalities of gait and mobility: Secondary | ICD-10-CM | POA: Diagnosis not present

## 2021-07-03 DIAGNOSIS — S76102D Unspecified injury of left quadriceps muscle, fascia and tendon, subsequent encounter: Secondary | ICD-10-CM | POA: Diagnosis not present

## 2021-07-04 DIAGNOSIS — J4520 Mild intermittent asthma, uncomplicated: Secondary | ICD-10-CM | POA: Diagnosis not present

## 2021-07-04 DIAGNOSIS — Z6827 Body mass index (BMI) 27.0-27.9, adult: Secondary | ICD-10-CM | POA: Diagnosis not present

## 2021-07-04 DIAGNOSIS — E1169 Type 2 diabetes mellitus with other specified complication: Secondary | ICD-10-CM | POA: Diagnosis not present

## 2021-07-04 DIAGNOSIS — I10 Essential (primary) hypertension: Secondary | ICD-10-CM | POA: Diagnosis not present

## 2021-07-08 DIAGNOSIS — M79652 Pain in left thigh: Secondary | ICD-10-CM | POA: Diagnosis not present

## 2021-07-08 DIAGNOSIS — M25662 Stiffness of left knee, not elsewhere classified: Secondary | ICD-10-CM | POA: Diagnosis not present

## 2021-07-08 DIAGNOSIS — R2689 Other abnormalities of gait and mobility: Secondary | ICD-10-CM | POA: Diagnosis not present

## 2021-07-14 DIAGNOSIS — M25662 Stiffness of left knee, not elsewhere classified: Secondary | ICD-10-CM | POA: Diagnosis not present

## 2021-07-14 DIAGNOSIS — M79652 Pain in left thigh: Secondary | ICD-10-CM | POA: Diagnosis not present

## 2021-07-14 DIAGNOSIS — R2689 Other abnormalities of gait and mobility: Secondary | ICD-10-CM | POA: Diagnosis not present

## 2021-07-21 DIAGNOSIS — M79652 Pain in left thigh: Secondary | ICD-10-CM | POA: Diagnosis not present

## 2021-07-21 DIAGNOSIS — M25662 Stiffness of left knee, not elsewhere classified: Secondary | ICD-10-CM | POA: Diagnosis not present

## 2021-07-21 DIAGNOSIS — R2689 Other abnormalities of gait and mobility: Secondary | ICD-10-CM | POA: Diagnosis not present

## 2021-08-05 DIAGNOSIS — M25662 Stiffness of left knee, not elsewhere classified: Secondary | ICD-10-CM | POA: Diagnosis not present

## 2021-08-05 DIAGNOSIS — M79652 Pain in left thigh: Secondary | ICD-10-CM | POA: Diagnosis not present

## 2021-08-05 DIAGNOSIS — R2689 Other abnormalities of gait and mobility: Secondary | ICD-10-CM | POA: Diagnosis not present

## 2021-08-12 DIAGNOSIS — M25662 Stiffness of left knee, not elsewhere classified: Secondary | ICD-10-CM | POA: Diagnosis not present

## 2021-08-12 DIAGNOSIS — R2689 Other abnormalities of gait and mobility: Secondary | ICD-10-CM | POA: Diagnosis not present

## 2021-08-12 DIAGNOSIS — M79652 Pain in left thigh: Secondary | ICD-10-CM | POA: Diagnosis not present

## 2021-08-26 DIAGNOSIS — M25662 Stiffness of left knee, not elsewhere classified: Secondary | ICD-10-CM | POA: Diagnosis not present

## 2021-08-26 DIAGNOSIS — M79652 Pain in left thigh: Secondary | ICD-10-CM | POA: Diagnosis not present

## 2021-08-26 DIAGNOSIS — R2689 Other abnormalities of gait and mobility: Secondary | ICD-10-CM | POA: Diagnosis not present

## 2021-09-03 DIAGNOSIS — M79652 Pain in left thigh: Secondary | ICD-10-CM | POA: Diagnosis not present

## 2021-09-03 DIAGNOSIS — M25662 Stiffness of left knee, not elsewhere classified: Secondary | ICD-10-CM | POA: Diagnosis not present

## 2021-09-03 DIAGNOSIS — R2689 Other abnormalities of gait and mobility: Secondary | ICD-10-CM | POA: Diagnosis not present

## 2021-09-09 DIAGNOSIS — M25662 Stiffness of left knee, not elsewhere classified: Secondary | ICD-10-CM | POA: Diagnosis not present

## 2021-09-09 DIAGNOSIS — M79652 Pain in left thigh: Secondary | ICD-10-CM | POA: Diagnosis not present

## 2021-09-09 DIAGNOSIS — R2689 Other abnormalities of gait and mobility: Secondary | ICD-10-CM | POA: Diagnosis not present

## 2021-09-16 DIAGNOSIS — M25662 Stiffness of left knee, not elsewhere classified: Secondary | ICD-10-CM | POA: Diagnosis not present

## 2021-09-16 DIAGNOSIS — M79652 Pain in left thigh: Secondary | ICD-10-CM | POA: Diagnosis not present

## 2021-09-16 DIAGNOSIS — R2689 Other abnormalities of gait and mobility: Secondary | ICD-10-CM | POA: Diagnosis not present

## 2021-09-23 DIAGNOSIS — R2689 Other abnormalities of gait and mobility: Secondary | ICD-10-CM | POA: Diagnosis not present

## 2021-09-23 DIAGNOSIS — M79652 Pain in left thigh: Secondary | ICD-10-CM | POA: Diagnosis not present

## 2021-09-23 DIAGNOSIS — M25662 Stiffness of left knee, not elsewhere classified: Secondary | ICD-10-CM | POA: Diagnosis not present

## 2021-10-07 DIAGNOSIS — M25562 Pain in left knee: Secondary | ICD-10-CM | POA: Diagnosis not present

## 2021-10-07 DIAGNOSIS — M25561 Pain in right knee: Secondary | ICD-10-CM | POA: Diagnosis not present

## 2021-10-17 DIAGNOSIS — E1169 Type 2 diabetes mellitus with other specified complication: Secondary | ICD-10-CM | POA: Diagnosis not present

## 2021-10-17 DIAGNOSIS — Z6828 Body mass index (BMI) 28.0-28.9, adult: Secondary | ICD-10-CM | POA: Diagnosis not present

## 2021-10-17 DIAGNOSIS — E7800 Pure hypercholesterolemia, unspecified: Secondary | ICD-10-CM | POA: Diagnosis not present

## 2021-10-17 DIAGNOSIS — I10 Essential (primary) hypertension: Secondary | ICD-10-CM | POA: Diagnosis not present

## 2021-10-17 DIAGNOSIS — E785 Hyperlipidemia, unspecified: Secondary | ICD-10-CM | POA: Diagnosis not present

## 2021-11-18 DIAGNOSIS — B079 Viral wart, unspecified: Secondary | ICD-10-CM | POA: Diagnosis not present

## 2022-03-03 DIAGNOSIS — E663 Overweight: Secondary | ICD-10-CM | POA: Diagnosis not present

## 2022-03-03 DIAGNOSIS — I10 Essential (primary) hypertension: Secondary | ICD-10-CM | POA: Diagnosis not present

## 2022-03-03 DIAGNOSIS — E7800 Pure hypercholesterolemia, unspecified: Secondary | ICD-10-CM | POA: Diagnosis not present

## 2022-03-03 DIAGNOSIS — E1169 Type 2 diabetes mellitus with other specified complication: Secondary | ICD-10-CM | POA: Diagnosis not present

## 2022-03-03 DIAGNOSIS — E785 Hyperlipidemia, unspecified: Secondary | ICD-10-CM | POA: Diagnosis not present

## 2022-03-03 DIAGNOSIS — Z6829 Body mass index (BMI) 29.0-29.9, adult: Secondary | ICD-10-CM | POA: Diagnosis not present

## 2022-05-04 DIAGNOSIS — H9209 Otalgia, unspecified ear: Secondary | ICD-10-CM | POA: Diagnosis not present

## 2022-05-04 DIAGNOSIS — H6122 Impacted cerumen, left ear: Secondary | ICD-10-CM | POA: Diagnosis not present

## 2022-06-02 DIAGNOSIS — E7800 Pure hypercholesterolemia, unspecified: Secondary | ICD-10-CM | POA: Diagnosis not present

## 2022-06-02 DIAGNOSIS — Z6828 Body mass index (BMI) 28.0-28.9, adult: Secondary | ICD-10-CM | POA: Diagnosis not present

## 2022-06-02 DIAGNOSIS — E663 Overweight: Secondary | ICD-10-CM | POA: Diagnosis not present

## 2022-06-02 DIAGNOSIS — E119 Type 2 diabetes mellitus without complications: Secondary | ICD-10-CM | POA: Diagnosis not present

## 2022-06-02 DIAGNOSIS — I10 Essential (primary) hypertension: Secondary | ICD-10-CM | POA: Diagnosis not present

## 2022-06-02 DIAGNOSIS — E785 Hyperlipidemia, unspecified: Secondary | ICD-10-CM | POA: Diagnosis not present

## 2022-09-22 DIAGNOSIS — M545 Low back pain, unspecified: Secondary | ICD-10-CM | POA: Diagnosis not present

## 2022-10-08 DIAGNOSIS — E7800 Pure hypercholesterolemia, unspecified: Secondary | ICD-10-CM | POA: Diagnosis not present

## 2022-10-08 DIAGNOSIS — Z6827 Body mass index (BMI) 27.0-27.9, adult: Secondary | ICD-10-CM | POA: Diagnosis not present

## 2022-10-08 DIAGNOSIS — I10 Essential (primary) hypertension: Secondary | ICD-10-CM | POA: Diagnosis not present

## 2022-10-08 DIAGNOSIS — E1169 Type 2 diabetes mellitus with other specified complication: Secondary | ICD-10-CM | POA: Diagnosis not present

## 2022-10-08 DIAGNOSIS — E663 Overweight: Secondary | ICD-10-CM | POA: Diagnosis not present

## 2022-11-11 DIAGNOSIS — L600 Ingrowing nail: Secondary | ICD-10-CM | POA: Diagnosis not present

## 2022-12-07 DIAGNOSIS — J0100 Acute maxillary sinusitis, unspecified: Secondary | ICD-10-CM | POA: Diagnosis not present

## 2022-12-07 DIAGNOSIS — R062 Wheezing: Secondary | ICD-10-CM | POA: Diagnosis not present

## 2022-12-19 DIAGNOSIS — I10 Essential (primary) hypertension: Secondary | ICD-10-CM | POA: Diagnosis not present

## 2022-12-19 DIAGNOSIS — J209 Acute bronchitis, unspecified: Secondary | ICD-10-CM | POA: Diagnosis not present

## 2022-12-19 DIAGNOSIS — J9801 Acute bronchospasm: Secondary | ICD-10-CM | POA: Diagnosis not present

## 2022-12-19 DIAGNOSIS — R051 Acute cough: Secondary | ICD-10-CM | POA: Diagnosis not present

## 2023-02-05 DIAGNOSIS — I10 Essential (primary) hypertension: Secondary | ICD-10-CM | POA: Diagnosis not present

## 2023-02-05 DIAGNOSIS — J4520 Mild intermittent asthma, uncomplicated: Secondary | ICD-10-CM | POA: Diagnosis not present

## 2023-02-05 DIAGNOSIS — Z6827 Body mass index (BMI) 27.0-27.9, adult: Secondary | ICD-10-CM | POA: Diagnosis not present

## 2023-02-05 DIAGNOSIS — E1169 Type 2 diabetes mellitus with other specified complication: Secondary | ICD-10-CM | POA: Diagnosis not present

## 2023-02-05 DIAGNOSIS — E663 Overweight: Secondary | ICD-10-CM | POA: Diagnosis not present

## 2023-02-05 DIAGNOSIS — Z23 Encounter for immunization: Secondary | ICD-10-CM | POA: Diagnosis not present

## 2023-02-05 DIAGNOSIS — E7800 Pure hypercholesterolemia, unspecified: Secondary | ICD-10-CM | POA: Diagnosis not present

## 2023-03-05 DIAGNOSIS — F17220 Nicotine dependence, chewing tobacco, uncomplicated: Secondary | ICD-10-CM | POA: Diagnosis not present

## 2023-03-05 DIAGNOSIS — Z0000 Encounter for general adult medical examination without abnormal findings: Secondary | ICD-10-CM | POA: Diagnosis not present

## 2023-03-05 DIAGNOSIS — Z125 Encounter for screening for malignant neoplasm of prostate: Secondary | ICD-10-CM | POA: Diagnosis not present

## 2023-03-13 DIAGNOSIS — Z1211 Encounter for screening for malignant neoplasm of colon: Secondary | ICD-10-CM | POA: Diagnosis not present

## 2023-04-05 DIAGNOSIS — Z1211 Encounter for screening for malignant neoplasm of colon: Secondary | ICD-10-CM | POA: Diagnosis not present

## 2023-04-05 DIAGNOSIS — E119 Type 2 diabetes mellitus without complications: Secondary | ICD-10-CM | POA: Diagnosis not present

## 2023-04-14 DIAGNOSIS — K635 Polyp of colon: Secondary | ICD-10-CM | POA: Diagnosis not present

## 2023-04-20 IMAGING — RF DG KNEE 1-2V*R*
1 series · 5 of 5 positions shown · non-contrast
Comparison: None.

CLINICAL DATA: 50-year-old male with a history of right knee
arthritis

EXAM:
RIGHT KNEE - 1-2 VIEW; DG C-ARM 1-60 MIN-NO REPORT

[Series 1: unknown protocol · 0.20mm/px · 5 of 5 slices shown]
[im 1/5]
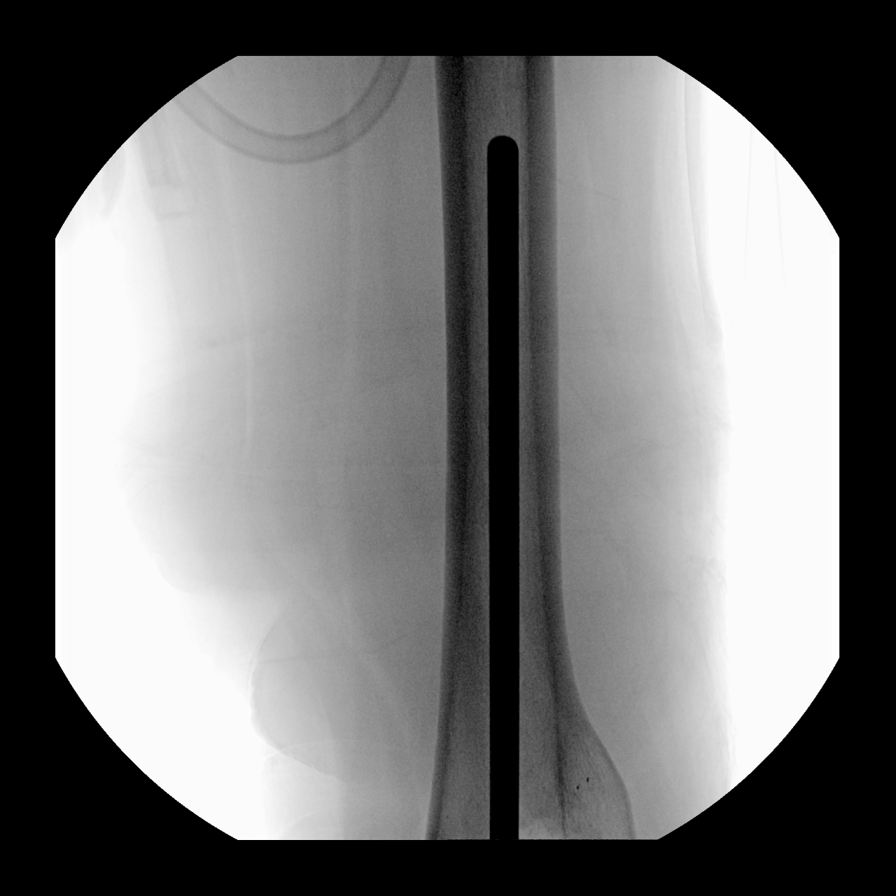
[im 2/5]
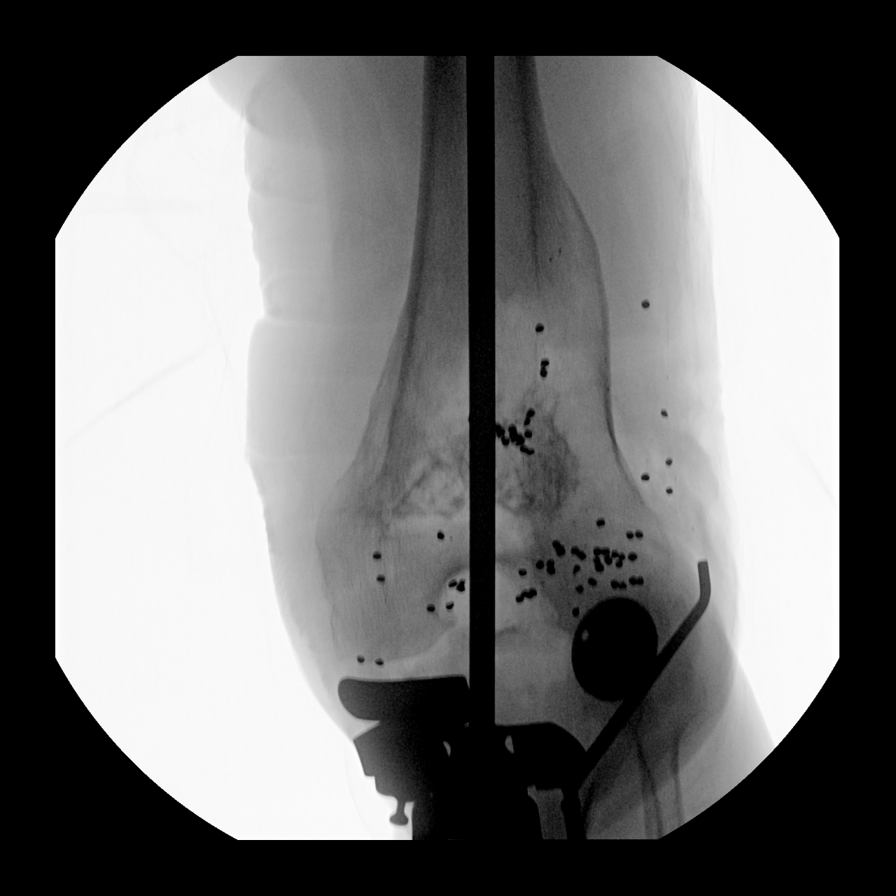
[im 3/5]
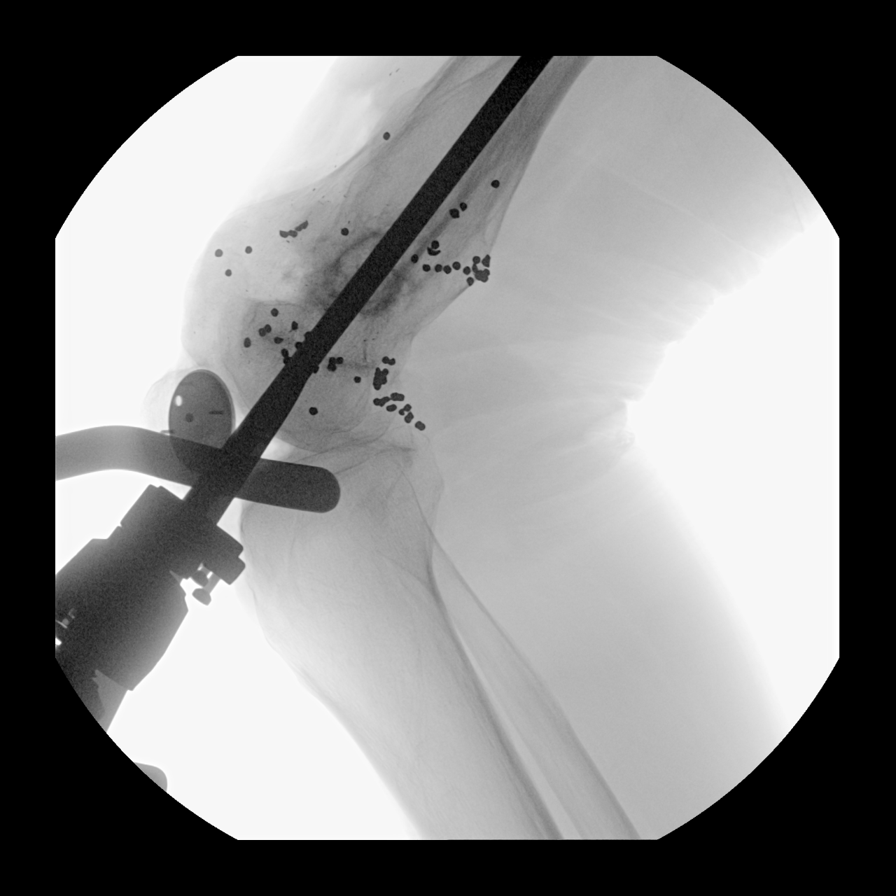
[im 4/5]
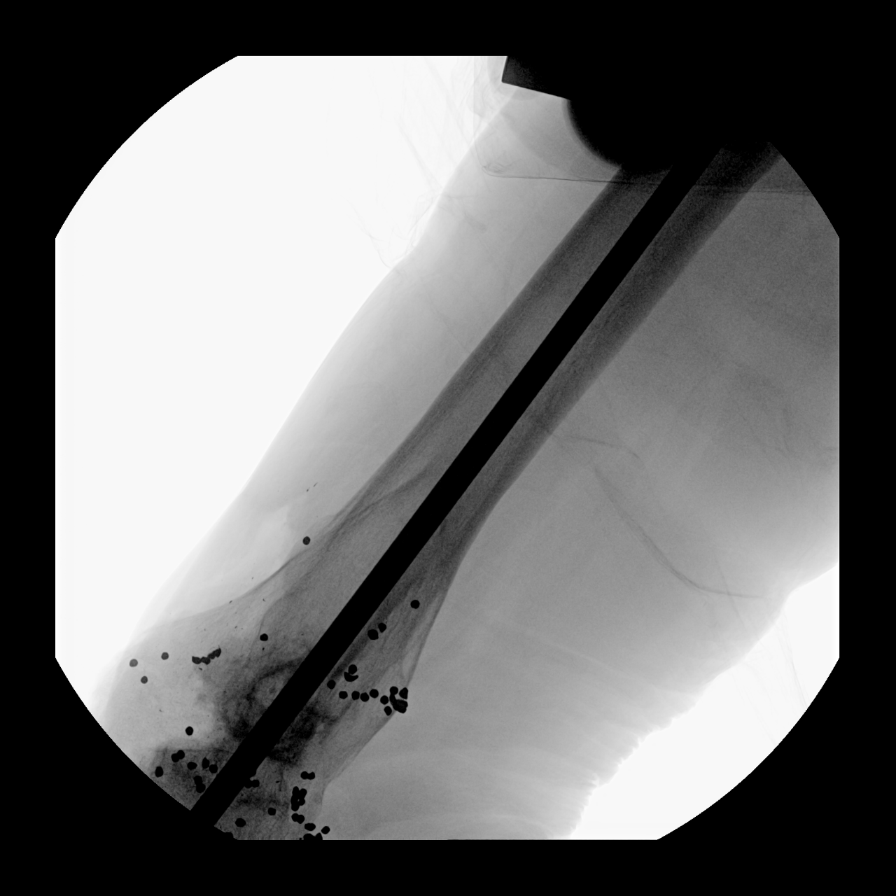
[im 5/5]
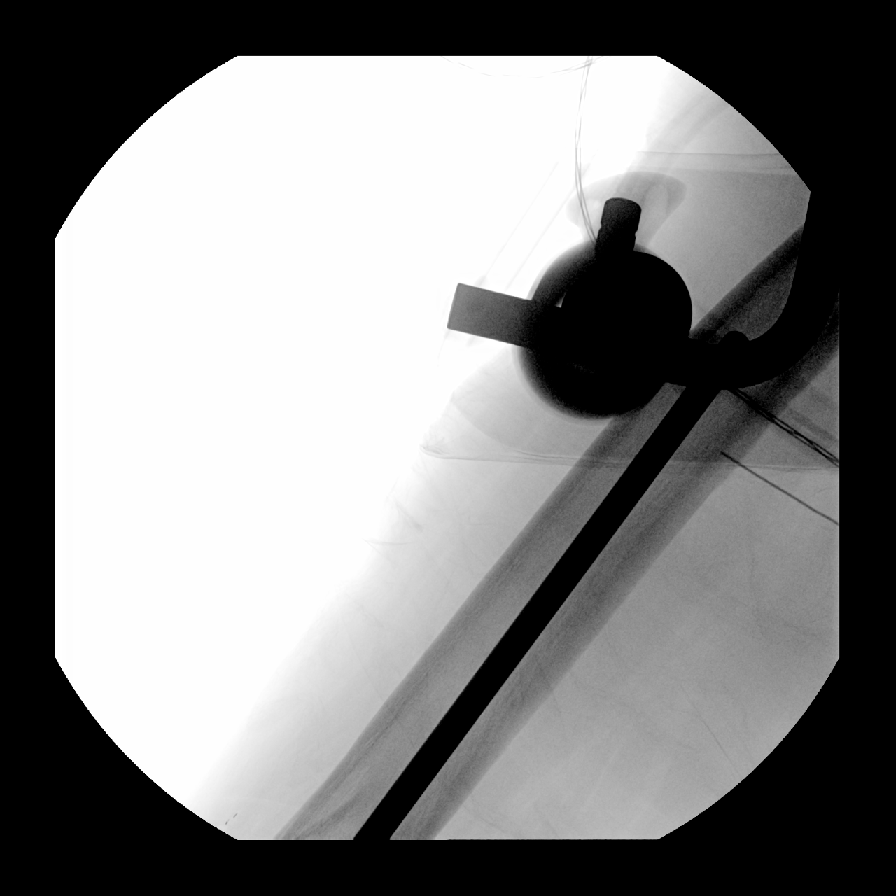

[5 of 5 positions shown; findings below may reference images not displayed]

FINDINGS: Limited intraoperative fluoroscopic spot images of right knee
arthroplasty. Redemonstration of retained metallic shrapnel.
IMPRESSION: Limited intraoperative fluoroscopic spot images of right knee
arthroplasty. Please refer to the dictated operative report for full
details of intraoperative findings and procedure.
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
## 2023-05-05 DIAGNOSIS — R202 Paresthesia of skin: Secondary | ICD-10-CM | POA: Diagnosis not present

## 2023-05-05 DIAGNOSIS — I10 Essential (primary) hypertension: Secondary | ICD-10-CM | POA: Diagnosis not present

## 2023-05-05 DIAGNOSIS — M25511 Pain in right shoulder: Secondary | ICD-10-CM | POA: Diagnosis not present

## 2023-05-05 DIAGNOSIS — M545 Low back pain, unspecified: Secondary | ICD-10-CM | POA: Diagnosis not present

## 2023-05-05 DIAGNOSIS — M5432 Sciatica, left side: Secondary | ICD-10-CM | POA: Diagnosis not present

## 2023-05-11 DIAGNOSIS — D126 Benign neoplasm of colon, unspecified: Secondary | ICD-10-CM | POA: Diagnosis not present

## 2023-05-11 DIAGNOSIS — R030 Elevated blood-pressure reading, without diagnosis of hypertension: Secondary | ICD-10-CM | POA: Diagnosis not present

## 2023-05-11 DIAGNOSIS — K648 Other hemorrhoids: Secondary | ICD-10-CM | POA: Diagnosis not present

## 2023-05-11 DIAGNOSIS — Z6827 Body mass index (BMI) 27.0-27.9, adult: Secondary | ICD-10-CM | POA: Diagnosis not present

## 2023-05-25 DIAGNOSIS — J4520 Mild intermittent asthma, uncomplicated: Secondary | ICD-10-CM | POA: Diagnosis not present

## 2023-05-25 DIAGNOSIS — E1169 Type 2 diabetes mellitus with other specified complication: Secondary | ICD-10-CM | POA: Diagnosis not present

## 2023-05-25 DIAGNOSIS — E7800 Pure hypercholesterolemia, unspecified: Secondary | ICD-10-CM | POA: Diagnosis not present

## 2023-05-25 DIAGNOSIS — Z23 Encounter for immunization: Secondary | ICD-10-CM | POA: Diagnosis not present

## 2023-05-25 DIAGNOSIS — I10 Essential (primary) hypertension: Secondary | ICD-10-CM | POA: Diagnosis not present
